# Patient Record
Sex: Male | Born: 2000 | Race: White | Hispanic: No | Marital: Single | Smoking: Current some day smoker
Health system: Southern US, Community
[De-identification: ages and names within clinical notes are randomized; demographics above are authoritative.]

---
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
## 2024-07-13 ENCOUNTER — Encounter: Payer: Self-pay | Admitting: Emergency Medicine

## 2024-07-13 ENCOUNTER — Emergency Department
Admission: EM | Admit: 2024-07-13 | Discharge: 2024-07-14 | Disposition: A | Discharge status: Home / Self Care | Attending: Emergency Medicine | Admitting: Emergency Medicine

## 2024-07-13 ENCOUNTER — Other Ambulatory Visit: Payer: Self-pay

## 2024-07-13 DIAGNOSIS — B349 Viral infection, unspecified: Secondary | ICD-10-CM | POA: Insufficient documentation

## 2024-07-13 DIAGNOSIS — R7989 Other specified abnormal findings of blood chemistry: Secondary | ICD-10-CM | POA: Diagnosis not present

## 2024-07-13 DIAGNOSIS — R0602 Shortness of breath: Secondary | ICD-10-CM | POA: Diagnosis present

## 2024-07-13 DIAGNOSIS — J029 Acute pharyngitis, unspecified: Secondary | ICD-10-CM | POA: Diagnosis not present

## 2024-07-13 DIAGNOSIS — R059 Cough, unspecified: Secondary | ICD-10-CM

## 2024-07-13 NOTE — ED Triage Notes (Signed)
 Patient ambulatory to triage with steady gait, without difficulty or distress noted; pt reports persistent body aches, chills, sinus congestion, nausea; tx recently for sinusitis and currently taking zpak

## 2024-07-14 ENCOUNTER — Emergency Department

## 2024-07-14 LAB — CBC WITH DIFFERENTIAL/PLATELET
Abs Immature Granulocytes: 0.02 K/uL (ref 0.00–0.07)
Basophils Absolute: 0.1 K/uL (ref 0.0–0.1)
Basophils Relative: 1 %
Eosinophils Absolute: 0.1 K/uL (ref 0.0–0.5)
Eosinophils Relative: 1 %
HCT: 42.1 % (ref 39.0–52.0)
Hemoglobin: 14.7 g/dL (ref 13.0–17.0)
Immature Granulocytes: 0 %
Lymphocytes Relative: 64 %
Lymphs Abs: 6.2 K/uL — ABNORMAL HIGH (ref 0.7–4.0)
MCH: 33 pg (ref 26.0–34.0)
MCHC: 34.9 g/dL (ref 30.0–36.0)
MCV: 94.6 fL (ref 80.0–100.0)
Monocytes Absolute: 0.6 K/uL (ref 0.1–1.0)
Monocytes Relative: 6 %
Neutro Abs: 2.8 K/uL (ref 1.7–7.7)
Neutrophils Relative %: 28 %
Platelets: 183 K/uL (ref 150–400)
RBC: 4.45 MIL/uL (ref 4.22–5.81)
RDW: 12.3 % (ref 11.5–15.5)
Smear Review: NORMAL
WBC: 9.7 K/uL (ref 4.0–10.5)
nRBC: 0 % (ref 0.0–0.2)

## 2024-07-14 LAB — CK: Total CK: 44 U/L — ABNORMAL LOW (ref 49–397)

## 2024-07-14 LAB — COMPREHENSIVE METABOLIC PANEL WITH GFR
ALT: 286 U/L — ABNORMAL HIGH (ref 0–44)
AST: 109 U/L — ABNORMAL HIGH (ref 15–41)
Albumin: 4 g/dL (ref 3.5–5.0)
Alkaline Phosphatase: 101 U/L (ref 38–126)
Anion gap: 10 (ref 5–15)
BUN: 14 mg/dL (ref 6–20)
CO2: 27 mmol/L (ref 22–32)
Calcium: 9.2 mg/dL (ref 8.9–10.3)
Chloride: 100 mmol/L (ref 98–111)
Creatinine, Ser: 0.98 mg/dL (ref 0.61–1.24)
GFR, Estimated: >60 mL/min (ref >60)
Glucose, Bld: 98 mg/dL (ref 70–99)
Potassium: 4.2 mmol/L (ref 3.5–5.1)
Sodium: 137 mmol/L (ref 135–145)
Total Bilirubin: 1.3 mg/dL — ABNORMAL HIGH (ref 0.0–1.2)
Total Protein: 7.1 g/dL (ref 6.5–8.1)

## 2024-07-14 LAB — TROPONIN I (HIGH SENSITIVITY): Troponin I (High Sensitivity): 3 ng/L (ref <18)

## 2024-07-14 LAB — RESP PANEL BY RT-PCR (RSV, FLU A&B, COVID)  RVPGX2
Influenza A by PCR: NEGATIVE
Influenza B by PCR: NEGATIVE
Resp Syncytial Virus by PCR: NEGATIVE
SARS Coronavirus 2 by RT PCR: NEGATIVE

## 2024-07-14 MED ORDER — SODIUM CHLORIDE 0.9 % IV BOLUS
1000.0000 mL | Freq: Once | INTRAVENOUS | Status: AC
  Administered 2024-07-14: 1000 mL via INTRAVENOUS

## 2024-07-14 NOTE — ED Notes (Signed)
 Called [facility] for monitoring

## 2024-07-14 NOTE — Discharge Instructions (Addendum)
 For your sore throat and cough, you can drink warm water with honey and lemon, you can also ginger.  For the body aches you can take Tylenol or ibuprofen  as needed.  I put in a referral for you for primary care.  Please make sure to keep her self hydrated, you can drink Gatorade, Pedialyte, soups.  Please ensure to follow-up with primary care to get repeat liver function testing done in a week since your liver enzymes were noted to be mildly elevated today.

## 2024-07-14 NOTE — ED Provider Notes (Signed)
 [facility] Provider Note    Event Date/Time   First MD Initiated Contact with Patient 07/13/24 2336     (approximate)   History   Chills   HPI  Christian Lawson is a 23 y.o. male otherwise healthy presenting with bodyaches, sore throat, cough, shortness of breath for the past week.  He denies any recent travel or surgeries, no history of blood clots or cardiac issues, no history of malignancies.  States that he is on a Z-Pak for possible sinusitis.  States that he is been feeling fatigued, has not been keeping up with his p.o. intake because he does not feel like getting out of bed.  Also noted that his urine was a little bit darker than usual without other urinary symptoms.  No sick contacts.  On independent chart review, he was seen by primary care on 19th for sore throat, lightheadedness and a headache.  He had a strep PCR that was negative.  Respiratory viral panel is negative.     Physical Exam   Triage Vital Signs: ED Triage Vitals  Encounter Vitals Group     BP 07/13/24 2318 128/78     Girls Systolic BP Percentile --      Girls Diastolic BP Percentile --      Boys Systolic BP Percentile --      Boys Diastolic BP Percentile --      Pulse Rate 07/13/24 2318 73     Resp 07/13/24 2318 17     Temp 07/13/24 2318 98.4 F (36.9 C)     Temp src --      SpO2 07/13/24 2318 99 %     Weight 07/13/24 2259 165 lb (74.8 kg)     Height 07/13/24 2259 5' 10 (1.778 m)     Head Circumference --      Peak Flow --      Pain Score 07/13/24 2258 7     Pain Loc --      Pain Education --      Exclude from Growth Chart --     Most recent vital signs: Vitals:   07/14/24 0229 07/14/24 0230  BP: 130/78 130/78  Pulse: 75 72  Resp: 15 15  Temp: 98.3 F (36.8 C)   SpO2:  99%     General: Awake, no distress.  CV:  Good peripheral perfusion.  Resp:  Normal effort.  Clear Abd:  No distention.  Nontender Other:  Oropharynx without tonsillar exudates, swelling  or erythema, mildly dry oral mucosa, nontoxic-appearing, no nuchal rigidity, no lower extremity edema unilateral calf sign or tenderness   ED Results / Procedures / Treatments   Labs (all labs ordered are listed, but only abnormal results are displayed) Labs Reviewed  COMPREHENSIVE METABOLIC PANEL WITH GFR - Abnormal; Notable for the following components:      Result Value   AST 109 (*)    ALT 286 (*)    Total Bilirubin 1.3 (*)    All other components within normal limits  CBC WITH DIFFERENTIAL/PLATELET - Abnormal; Notable for the following components:   Lymphs Abs 6.2 (*)    All other components within normal limits  CK - Abnormal; Notable for the following components:   Total CK 44 (*)    All other components within normal limits  RESP PANEL BY RT-PCR (RSV, FLU A&B, COVID)  [id number]  TROPONIN I (HIGH SENSITIVITY)     EKG  EKG shows, sinus rhythm, rate 60, normal  QS, normal QTc, no obvious ischemic ST elevation, no prior to compare   RADIOLOGY On my independent interpretation, chest x-ray without obvious consolidation   PROCEDURES:  Critical Care performed: No  Procedures   MEDICATIONS ORDERED IN ED: Medications  sodium chloride  0.9 % bolus 1,000 mL (0 mLs Intravenous Stopped 07/14/24 0229)     IMPRESSION / MDM / ASSESSMENT AND PLAN / ED COURSE  I reviewed the triage vital signs and the nursing notes.                              Differential diagnosis includes, but is not limited to, viral illness, electrolyte derangements, atypical ACS, arrhythmia, dehydration, pneumonia, consider PE but he has no other risk factors, not tachypneic, tachycardic or hypoxic.  For the dark urine suspect this might be related to dehydration but also did consider rhabdo.  Will get CK, labs, EKG, troponin, chest x-ray, IV fluids.  Patient's presentation is most consistent with acute presentation with potential threat to life or bodily function.  Independent interpretation of labs and  imaging below.  Discussed with patient about imaging and lab results including incidental findings.  Will put in a primary care referral for him so they can follow-up outpatient and get repeat LFTs in a week.  Encouraged hydration, considered but no indication for inpatient admission at this time, he safe for outpatient management.  Will discharge with strict return precautions.  Shared decision making done with patient and he is agreeable with this plan.  The patient is on the cardiac monitor to evaluate for evidence of arrhythmia and/or significant heart rate changes.   Clinical Course as of 07/14/24 0348  Wed Jul 14, 2024  0134 DG Chest 1 View 1. No acute process.  [TT]  [id number] On reassessment patient is feeling a lot better after fluids, labs show his LFTs are elevated, he does not have any abdominal pain or jaundice on exam, discussed with him about imaging and lab results including incidental findings of the elevated LFTs, discussed with him about possible ultrasound and he would like to proceed.  Will order the right upper quadrant ultrasound here.  Suspect his LFTs are elevated secondary to his viral illness. [TT]  [id number] Independent review of labs, CK is not elevated, troponin is not elevated, no leukocytosis, electrolyte severely deranged, AST and ALT as well as T. bili are elevated.  Alk phos is normal. [TT]  0345 US  ABDOMEN LIMITED RUQ (LIVER/GB) 1. No acute findings.  [TT]    Clinical Course User Index [TT] Waymond Lorelle Cummins, MD     FINAL CLINICAL IMPRESSION(S) / ED DIAGNOSES   Final diagnoses:  Shortness of breath  Cough, unspecified type  Sore throat  Viral illness  Elevated LFTs     Rx / DC Orders   ED Discharge Orders          Ordered    Ambulatory Referral to Primary Care (Establish Care)        07/14/24 0346             Note:  This document was prepared using Dragon voice recognition software and may include unintentional dictation errors.    Waymond Lorelle Cummins,  MD 07/14/24 [phone]
